# Patient Record
Sex: Female | Born: 1985 | Hispanic: No | Marital: Married | Smoking: Never smoker
Health system: Southern US, Community
[De-identification: ages and names within clinical notes are randomized; demographics above are authoritative.]

## PROBLEM LIST (undated history)

## (undated) DIAGNOSIS — K760 Fatty (change of) liver, not elsewhere classified: Secondary | ICD-10-CM

---
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
## 2010-02-16 ENCOUNTER — Encounter: Payer: Self-pay | Admitting: Maternal & Fetal Medicine

## 2010-02-28 ENCOUNTER — Ambulatory Visit: Payer: Self-pay | Admitting: Internal Medicine

## 2013-01-22 ENCOUNTER — Ambulatory Visit: Payer: Self-pay | Admitting: Family Medicine

## 2013-02-21 ENCOUNTER — Ambulatory Visit: Payer: Self-pay | Admitting: Physician Assistant

## 2015-03-06 IMAGING — CR DG ANKLE COMPLETE 3+V*L*
1 series · 3 of 3 positions shown · non-contrast
Comparison: None.

CLINICAL DATA: Left ankle injury

EXAM:
LEFT ANKLE COMPLETE - 3+ VIEW

[Series 1: ap · 0.17mm/px · 3 of 3 slices shown]
[im 1/3]
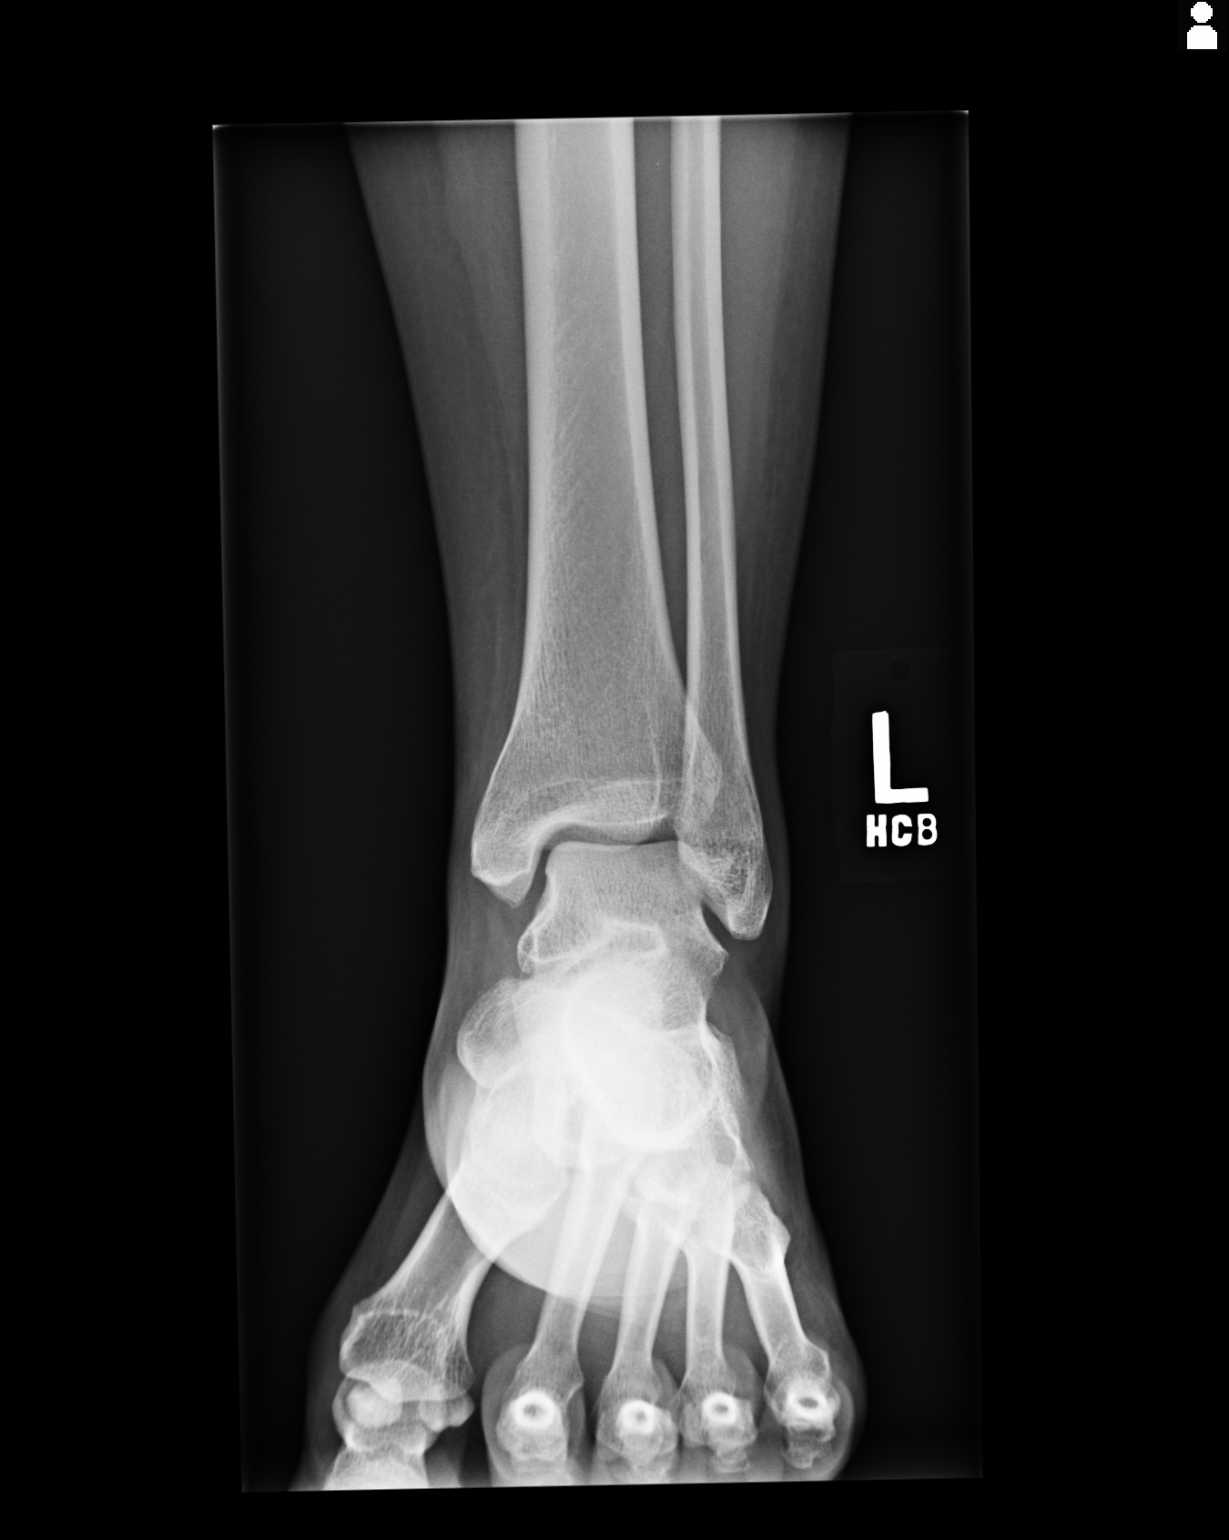
[im 2/3]
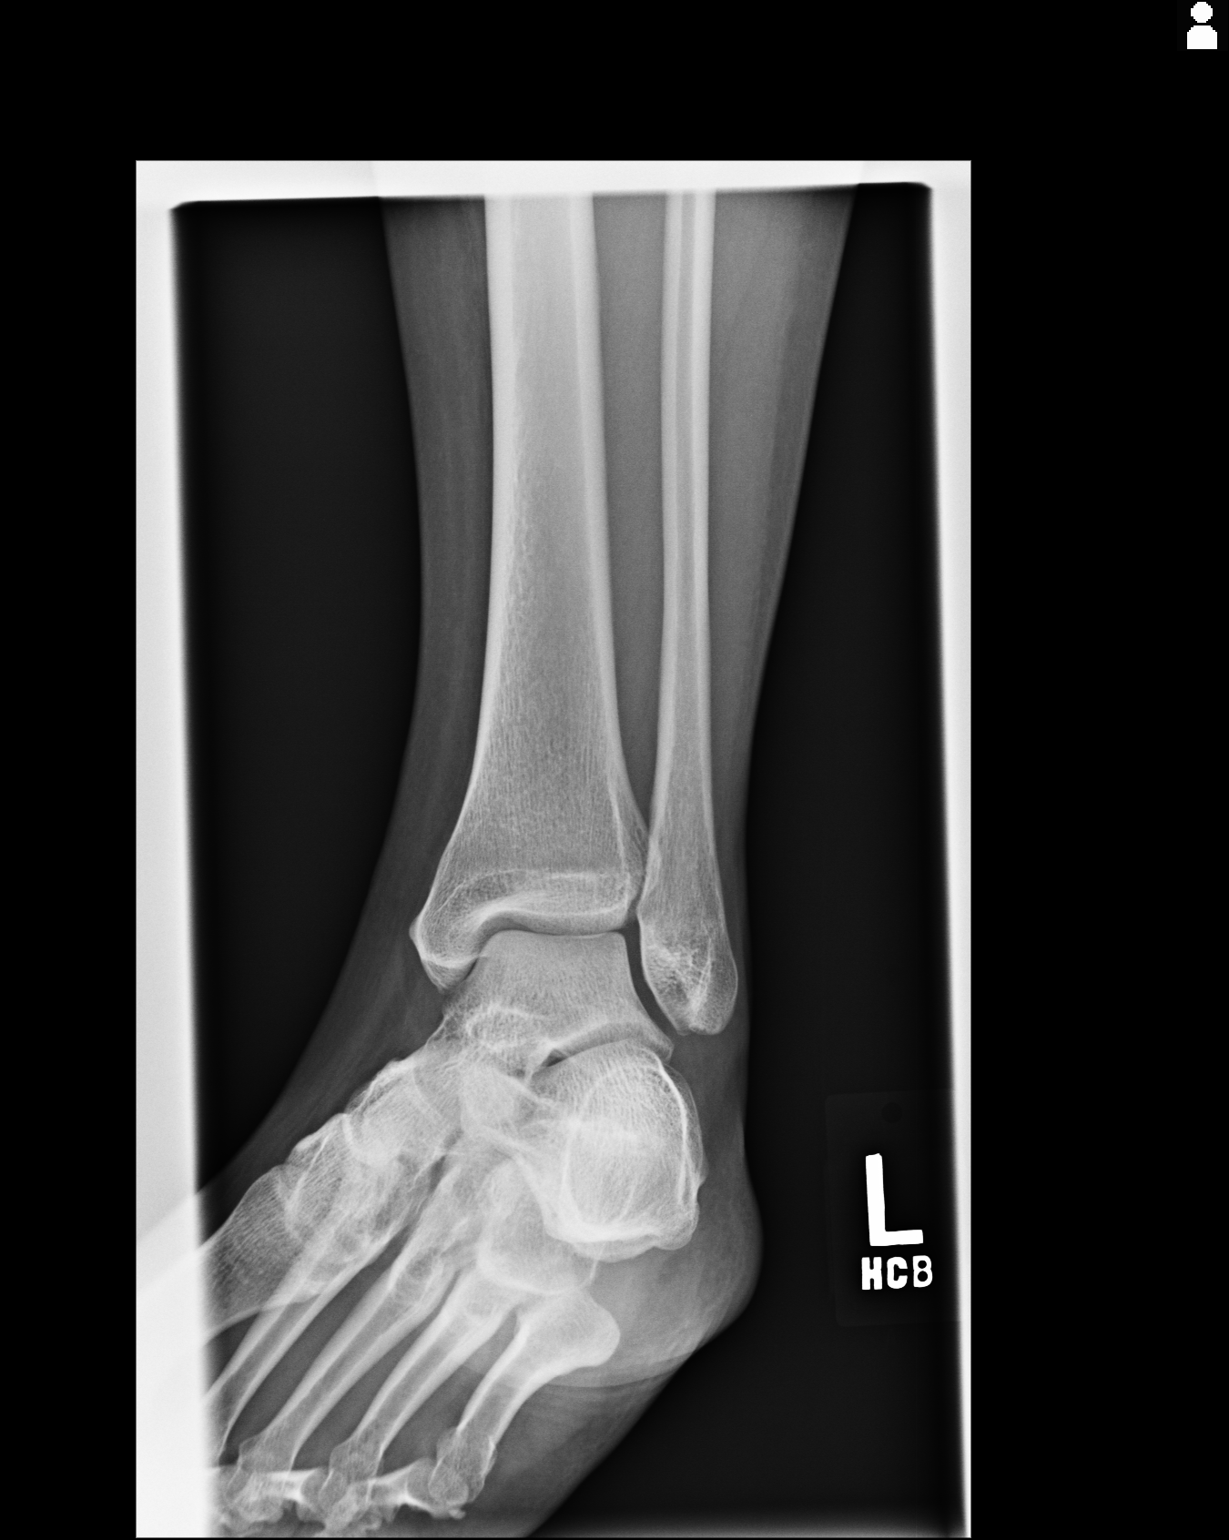
[im 3/3]
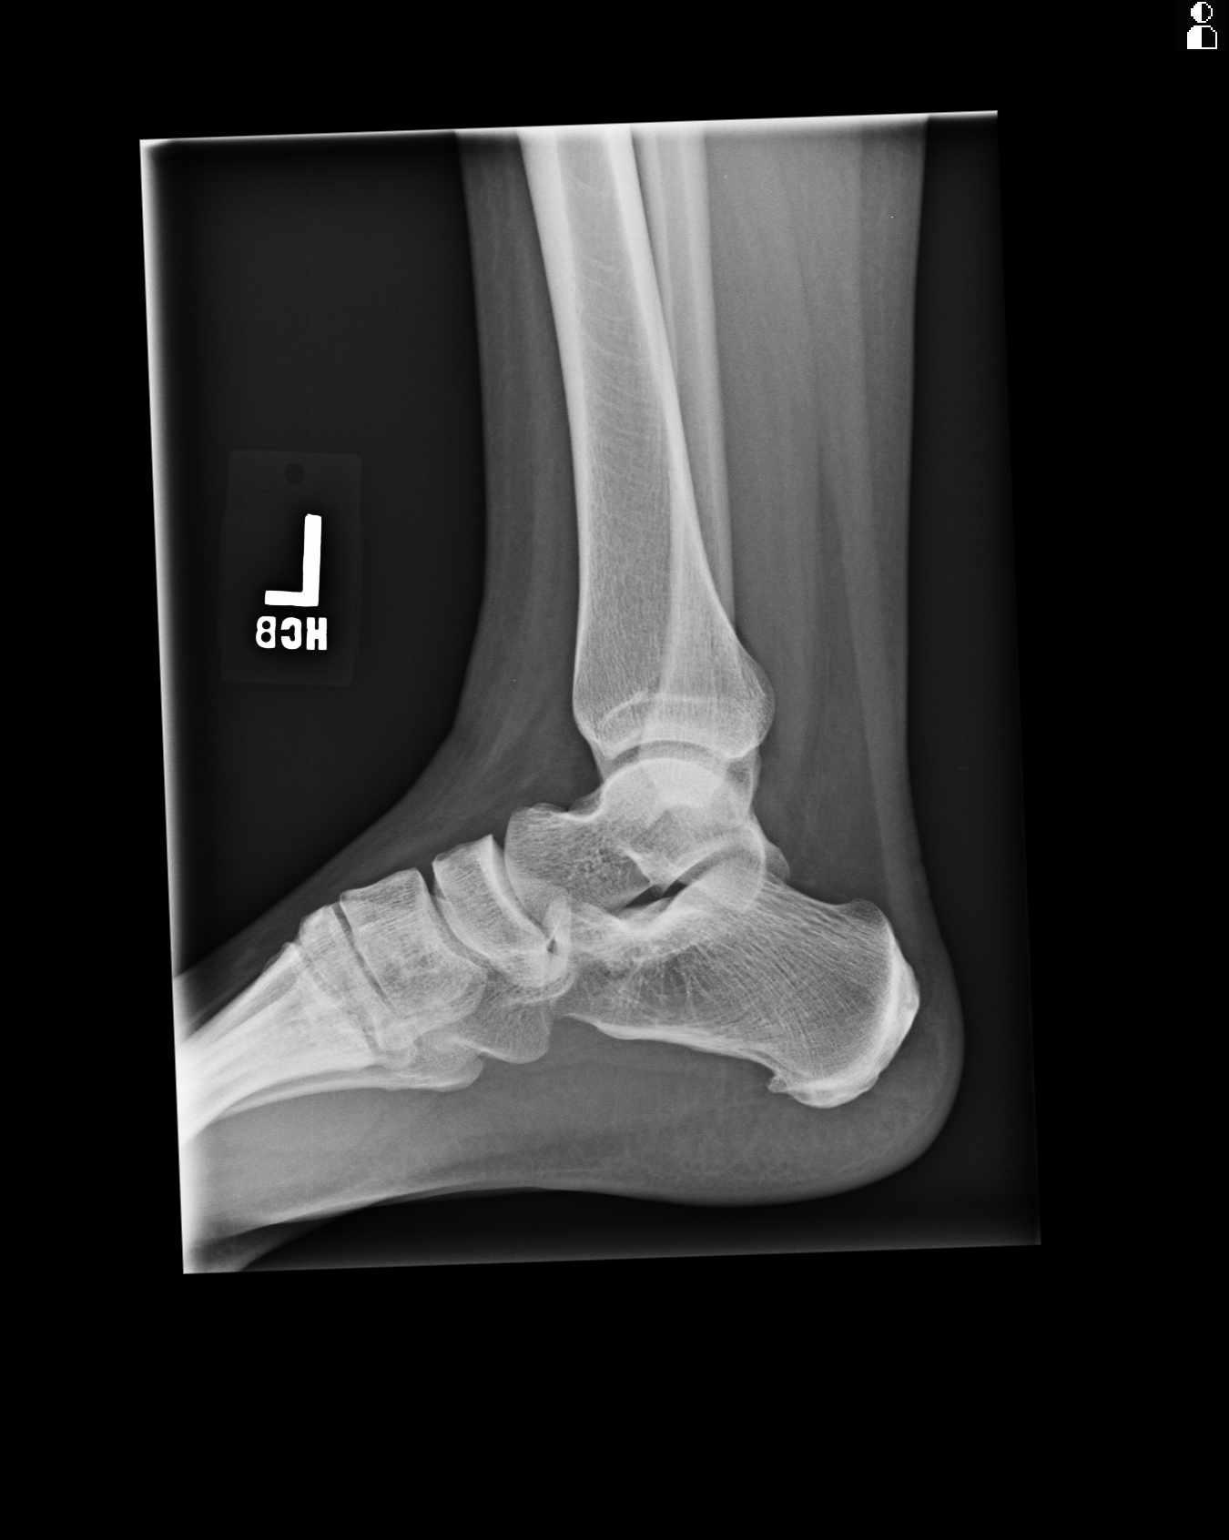

[3 of 3 positions shown; findings below may reference images not displayed]

FINDINGS: Three views of left ankle submitted. No acute fracture or
subluxation. Ankle mortise is preserved. Small plantar spur of
calcaneus.
IMPRESSION: Negative.

## 2015-08-04 ENCOUNTER — Encounter: Payer: Self-pay | Admitting: *Deleted

## 2015-08-04 ENCOUNTER — Ambulatory Visit (INDEPENDENT_AMBULATORY_CARE_PROVIDER_SITE_OTHER): Payer: BLUE CROSS/BLUE SHIELD

## 2015-08-04 ENCOUNTER — Ambulatory Visit
Admission: EM | Admit: 2015-08-04 | Discharge: 2015-08-04 | Disposition: A | Discharge status: Home / Self Care | Payer: BLUE CROSS/BLUE SHIELD | Attending: Family Medicine | Admitting: Family Medicine

## 2015-08-04 DIAGNOSIS — S93401A Sprain of unspecified ligament of right ankle, initial encounter: Secondary | ICD-10-CM

## 2015-08-04 HISTORY — DX: Fatty (change of) liver, not elsewhere classified: K76.0

## 2015-08-04 MED ORDER — KETOROLAC TROMETHAMINE 10 MG PO TABS: 10.0000 mg | ORAL_TABLET | Freq: Four times a day (QID) | ORAL | 0 refills | Status: AC | PRN

## 2015-08-04 MED ORDER — IBUPROFEN 800 MG PO TABS
800.0000 mg | ORAL_TABLET | Freq: Once | ORAL | Status: AC
  Administered 2015-08-04: 800 mg via ORAL

## 2015-08-04 NOTE — ED Triage Notes (Signed)
Pt twisted right ankle and has mild edema to lateral malleolus region and pain with difficulty bearing weight.

## 2015-08-04 NOTE — ED Provider Notes (Signed)
CSN: [id number]     Arrival date & time 08/04/15  1902 History   None    Chief Complaint  Patient presents with  . Ankle Pain   (Consider location/radiation/quality/duration/timing/severity/associated sxs/prior Treatment) Married hispanic female here with spouse fell off curb in 4 inch heels 1 hour ago and twisted right ankle thinks she broke it here for xrays unable to bear weight.  Sprained left ankle last year has cam boot with her in case this one needs immobilization also.  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  Ankle Pain  Location:  Ankle and leg Time since incident:  1 hour Injury: yes   Mechanism of injury: fall   Fall:    Fall occurred:  Standing   Height of fall:  6 inches   Impact surface:  Concrete Ankle location:  R ankle Pain details:    Quality:  Shooting and sharp   Radiates to:  Does not radiate   Severity:  Severe   Onset quality:  Sudden   Duration:  1 hour   Timing:  Constant   Progression:  Worsening Chronicity:  New Foreign body present:  No foreign bodies Prior injury to area:  No Relieved by:  Nothing Worsened by:  Activity, bearing weight, extension, flexion, adduction and abduction Ineffective treatments:  Ice, immobilization and elevation Associated symptoms: decreased ROM and swelling   Associated symptoms: no back pain, no fatigue, no fever, no itching, no muscle weakness, no neck pain, no numbness, no stiffness and no tingling   Risk factors: obesity   Risk factors: no concern for non-accidental trauma, no frequent fractures, no known bone disorder and no recent illness     Past Medical History:  Diagnosis Date  . Fatty liver    Past Surgical History:  Procedure Laterality Date  . CESAREAN SECTION     History reviewed. No pertinent family history. Social History  Substance Use Topics  . Smoking status: Never Smoker  . Smokeless tobacco: Never Used  .  Alcohol use Yes   OB History    No data available     Review of Systems  Constitutional: Negative for fatigue and fever.  Eyes: Negative for photophobia, pain, discharge, redness, itching and visual disturbance.  Respiratory: Negative for shortness of breath and wheezing.   Cardiovascular: Negative for chest pain.  Gastrointestinal: Negative for diarrhea, nausea and vomiting.  Endocrine: Negative for polydipsia, polyphagia and polyuria.  Genitourinary: Negative for difficulty urinating.  Musculoskeletal: Positive for arthralgias, gait problem, joint swelling and myalgias. Negative for back pain, neck pain, neck stiffness and stiffness.  Skin: Positive for color change. Negative for itching, pallor, rash and wound.  Allergic/Immunologic: Negative for food allergies.  Neurological: Negative for dizziness, tremors, seizures, syncope, facial asymmetry, speech difficulty, weakness, light-headedness, numbness and headaches.  Hematological: Negative for adenopathy. Does not bruise/bleed easily.  Psychiatric/Behavioral: Negative for sleep disturbance.    Allergies  Review of patient's allergies indicates no known allergies.  Home Medications   Prior to Admission medications   Medication Sig Start Date End Date Taking? Authorizing Provider  amphetamine-dextroamphetamine (ADDERALL XR) 20 MG 24 hr capsule Take 20 mg by mouth daily.   Yes Historical Provider, MD  FLUoxetine (PROZAC) 20 MG tablet Take 20 mg by mouth daily.   Yes Historical Provider, MD  ketorolac (TORADOL) 10 MG tablet Take 1 tablet (10 mg total) by mouth every 6 (six) hours as  needed. 08/04/15   Barbaraann Barthel, NP   Meds Ordered and Administered this Visit   Medications  ibuprofen (ADVIL,MOTRIN) tablet 800 mg (800 mg Oral Given 08/04/15 2002)    BP (!) 130/91 (BP Location: Left Arm)   Pulse 91   Temp 99 F (37.2 C) (Oral)   Resp 16   Ht 5\' 3"  (1.6 m)   Wt 201 lb (91.2 kg)   SpO2 100%   BMI 35.61 kg/m  No data  found.   Physical Exam  Constitutional: She is oriented to person, place, and time. She appears well-developed and well-nourished. No distress.  HENT:  Head: Normocephalic and atraumatic.  Eyes: Conjunctivae are normal.  Neck: Neck supple.  Cardiovascular: Normal rate and regular rhythm.   No murmur heard. Pulmonary/Chest: Effort normal and breath sounds normal. No respiratory distress.  Abdominal: Soft. There is no tenderness.  Musculoskeletal: She exhibits tenderness. She exhibits no edema or deformity.       Right shoulder: Normal.       Left shoulder: Normal.       Right elbow: Normal.      Left elbow: Normal.       Right hip: Normal.       Left hip: Normal.       Right knee: Normal.       Left knee: Normal.       Right ankle: She exhibits decreased range of motion, swelling and ecchymosis. She exhibits no deformity, no laceration and normal pulse. Tenderness. Lateral malleolus, head of 5th metatarsal and proximal fibula tenderness found. No medial malleolus tenderness found. Achilles tendon normal.       Left ankle: Normal.       Cervical back: Normal.       Thoracic back: Normal.       Lumbar back: Normal.       Right hand: Normal.       Left hand: Normal.       Right upper leg: Normal.       Left upper leg: Normal.       Right lower leg: She exhibits tenderness and bony tenderness. She exhibits no swelling, no edema, no deformity and no laceration.       Left lower leg: Normal. She exhibits no tenderness, no bony tenderness, no swelling, no edema, no deformity and no laceration.       Right foot: There is tenderness, bony tenderness and swelling. There is normal range of motion, normal capillary refill, no crepitus, no deformity and no laceration.       Left foot: Normal.       Feet:  Neurological: She is alert and oriented to person, place, and time. She has normal strength. She is not disoriented. She displays no atrophy and no tremor. No sensory deficit. She exhibits  normal muscle tone. She displays no seizure activity. Gait abnormal. Coordination normal. GCS eye subscore is 4. GCS verbal subscore is 5. GCS motor subscore is 6.  Skin: Skin is warm and dry. Capillary refill takes less than 2 seconds. Bruising and ecchymosis noted. No abrasion, no burn, no laceration, no lesion, no petechiae and no rash noted. No cyanosis or erythema. No pallor. Nails show no clubbing.  Psychiatric: She has a normal mood and affect. Her speech is normal and behavior is normal. Judgment and thought content normal. She is not actively hallucinating. Cognition and memory are normal. She is attentive.  Nursing note and vitals reviewed.   Urgent Care Course  Clinical Course    Procedures (including critical care time)  Labs Review Labs Reviewed - No data to display  Imaging Review Dg Ankle Complete Right  Result Date: 08/04/2015 CLINICAL DATA:  Twisted right ankle EXAM: RIGHT ANKLE - COMPLETE 3+ VIEW COMPARISON:  None. FINDINGS: No fracture or dislocation is seen. The ankle mortise is intact. The base of the fifth metatarsal is unremarkable. Moderate lateral soft tissue swelling. IMPRESSION: No fracture or dislocation is seen. Moderate lateral soft tissue swelling. Electronically Signed   By: Charline Bills M.D.   On: 08/04/2015 20:05  Reviewed [place] Controlled Substances Website: Last Name:  [facility]:  First Name:  [facility]  Zip Code:  Date of Birth:  1985/09/15  Dispensed Start Date:  01/09/2013 Dispensed End Date:  08/04/2015  Recipients:       Dispensed From 01/09/2013 to 08/04/2015 2 out of 2 Recipient(s) Selected Rispoli, Kimanh - DOB: 07-04-1985 - [address] [place], [place] - DOB: 1985/10/28 - [address] " Date Dispensed/ Date Prescribed Drug Name/ NDC Qty. Dispensed/ Days Supply Refill #/ Authorized Refills Gaffer Recipient *Pmt. Method **MED Daily  06/23/2015 06/23/2015 DEXTROAMP- AMPHETAMIN 10 MG  TAB [id number] 60 30 0 0 [id number] MCDANIELS CHRISTOPHER MD [place], [place] [id number] [facility] [place], [place] ARDELL, AARONSON F 17-Aug-1985 [address] [place], [place] [address] 04 0  04/16/2015 04/16/2015 ACETAMINOPHEN- COD #3 TABLET [id number] 4 1 0 0 [id number] Purnell Shoemaker MD [place], [place] [id number] [place] [facility] Teodoro Kil [place] MYRTA, MERCER 1985/08/03 [address] [place], [place] [address] 04 18  03/26/2015 03/24/2015 DEXTROAMP- AMPHETAMIN 10 MG TAB [id number] 60 30 0 0 [id number] MCDANIELS CHRISTOPHER MD [place], [place] [id number] [facility] [place], [place] JAEDA, BRUSO F 1985/03/28 [address] [place], [place] [address] 04 0  11/16/2014 11/11/2014 DEXTROAMP- AMPHETAMIN 10 MG TAB [id number] 60 30 0 0 [id number] MCDANIELS CHRISTOPHER MD [place], [place] [id number] [facility] [place], [place] DANETTE, WEINFELD F 07-10-1985 [address] [place], [place] [address] 04 0  07/14/2014 07/14/2014 DEXTROAMP- AMPHETAMIN 10 MG TAB [id number] 60 30 0 0 [id number] MCDANIELS CHRISTOPHER MD [place], [place] [id number] [place] [facility] [place], [employer] MARWA, FUHRMAN [birth year] [address] [place], [place] [address] 04 0  05/08/2014 05/08/2014 DEXTROAMP- AMPHETAMIN 10 MG TAB [id number] 60 30 0 0 [id number] MCDANIELS CHRISTOPHER MD [place], [place] [id number] [place] [facility] [place], [place] TANNISHA, KENNINGTON 20-Feb-1985 [address] [place], [place] [address] 04 0  03/28/2014 03/28/2014 DEXTROAMP- AMPHETAMIN 10 MG TAB [id number] 60 30 0 0 [id number] MCDANIELS CHRISTOPHER MD [place], [place] [id number] [place] [facility] [place], [place] BUFFIE, HERNE 10/13/1985 [address] [place], [place] [address] 04 0  01/23/2014 01/23/2014 DEXTROAMP- AMPHETAMIN 10 MG TAB [id number] 60 30 0 0 [id number] MCDANIELS CHRISTOPHER MD [place], [place] [id number] [place] [facility] [place], [place] CANDELA, KRUL 1985-05-29 [address] [place], [place] [address] 04 0  12/24/2013 12/18/2013 DEXTROAMP- AMPHETAMIN 10 MG TAB [id number] 45 30 0 0 [id number] MCDANIELS CHRISTOPHER MD [place], [place] [id number] [place] [facility] [place], [place] SHALONDRA, WUNSCHEL [birth year] [address] [place], [place] [address] 04 0  11/22/2013 11/22/2013 DEXTROAMP- AMPHETAMIN 10 MG TAB [id number] 45 30 0 0 [id number] MCDANIELS CHRISTOPHER MD [place], [place] [id number] [place] [facility] [place],  JAPLEEN, TORNOW 03/29/1985 [address] [place], [place] [address] 04  0  10/24/2013 10/18/2013 DEXTROAMP- AMPHETAMIN 10 MG TAB [id number] 45 30 0 0 [id number] MCDANIELS CHRISTOPHER MD [place], [place] [id number] [place] [facility] [place], [place] JALEE, SAINE 1985/10/05 [address] [place], [place] [address] 04 0  08/29/2013 08/18/2013 DEXTROAMP- AMPHETAMIN 10 MG TAB [id number] 45 30 0 0 [id number] MCDANIELS CHRISTOPHER MD [place], [place] [id number] [employer] Jeanice Lim [place], [place] 07/31/1985 [address] [place], [place] [address] 04 0  08/08/2013 08/08/2013 HYDROCODON- ACETAMINOPHEN 5- 325 [id number] 30 4 0 0 [id number] DUBERMAN ERIC D MD [place], [place] [id number] [place] [facility] [place], [place] MANYA, BALASH [birth year] [address] [place], [place] [address] 04 37.5  07/18/2013 07/18/2013 DEXTROAMP- AMPHETAMIN 10 MG TAB [id number] 45 30 0 0 [id number] MCDANIELS CHRISTOPHER MD [place], [place] [id number] [employer] Jeanice Lim [place] Naveya, [place] Jul 22, 1985 [address] [place], [place] [address] 04 0  06/24/2013 06/16/2013 DEXTROAMP- AMPHETAMIN 10 MG TAB [id number] 45 30 0 0 [id number] MCDANIELS CHRISTOPHER MD [place], [place] [id number] [employer] Jeanice Lim, [place], [place] 11-Nov-1985 [address] [place], [place] [address] 04 0  05/16/2013 05/16/2013 DEXTROAMP- AMPHETAMIN 10 MG TAB [id number] 45 30 0 0  [id number] PATEL KIRTIDA [place], [place] [id number] [place] [facility] [place], [place] ZAMANI, CROCKER November 22, 1985 [address] [place], [place] [address] 04 0  03/02/2013 03/02/2013 OXYCODONE- ACETAMINOPHEN 5- 325 [id number] 12 2 0 0 [id number] [facility] [place], [place] [id number] [place] [facility] [place], [place] JUANICE, WARBURTON October 25, 1985 [address] [place], [place] [address] 04 45  01/22/2013 01/22/2013 HYDROCODON- ACETAMINOPHEN 5- 300 [id number] 12 4 0 0 [id number] PATEL KIRTIDA [place], [place] [id number] [place] [facility] [place], [place] TERRELL, SHIMKO F 1985-10-10 [address] [place], [place] [address] 04 15    *Pmt. Method:01=Private Pay; 02=Medicaid; 03=Medicare; 04=Commercial Insurance; Psychologist, educational and VA; 06=Worker's Compensation; 07=Indian Nations; 99=Other  **Per CDC guidance, the conversion factors and associated daily morphine milligram equivalents for drugs prescribed as part of medication-assisted treatment for opioid use disorder should not be used to benchmark against dosage thresholds meant for opioids prescribed for pain. MED Summary This section displays cumulative MED values by unique recipient. The "MED Max" value is the maximum occurrence of cumulative MED sustained for any 3 consecutive days. This value is calculated based on prescriptions dispensed during the date range requested.  MED Max Recipient  [address] ALLIX, BLOMQUIST 11-04-1985 [address] [place], [place] [address]   2002 motrin  po given by RN Scarlette Calico to patient, ice on right ankle and elevated in exam room for entire duration of visit except for trip to xray department in wheelchair.  2035 patient reported slight relief of pain with rest, elevation and motrin.  Discussed xray results with patient and given copy of radiology report negative for fracture.  Patient and spouse verbalized understanding information and had no further questions at this  time.   MDM   1. Ankle sprain, right, initial encounter    Patient was instructed to rest, ice and elevate the ankle as much as possible.  Activity as tolerated and work on ROM exercises.  Patient is to take toradol  po QID starting tomorrow.  Had motrin today.  Discussed no tylenol/motrin/ibuprofen/naproxen/advil or aleve while on toradol and max days 5 for use toradol.  Discussed at risk to reinjure ankle over the next year and to wear supportive footwear/ankle sleeve/ace  bandage.  Follow up with PCM if symptoms persist greater than 4 weeks for re-evaluation.  Did not require work restriction note.  Spouse and  Patient verbalized agreement and understanding of treatment plan and had no further questions at this time  Patient has crutches and cam boot at home.   P2:  Injury Prevention and Fitness.    Barbaraann Barthel, NP 08/04/15 2039

## 2017-08-16 IMAGING — CR DG ANKLE COMPLETE 3+V*R*
3 series · 3 of 3 positions shown · non-contrast
Comparison: None.

CLINICAL DATA: Twisted right ankle

EXAM:
RIGHT ANKLE - COMPLETE 3+ VIEW

[ankle obl (1 of 2)]
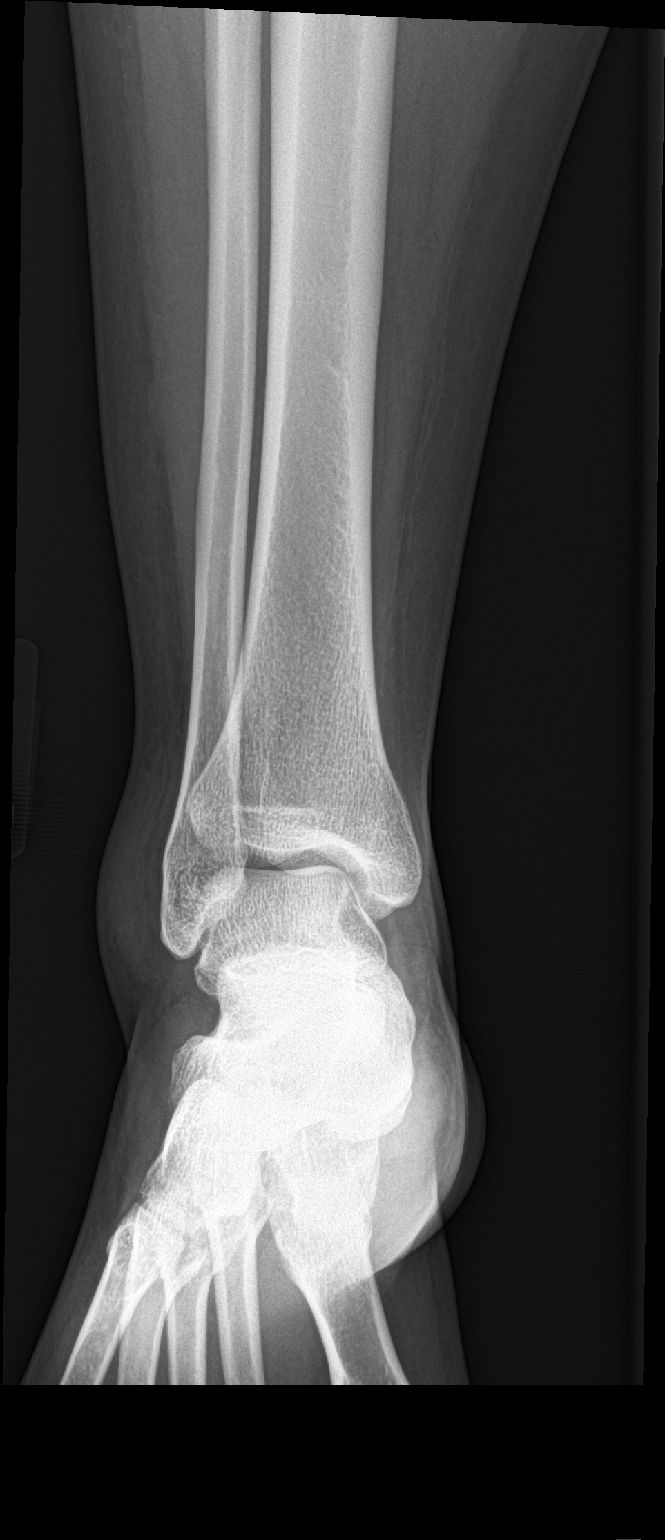

[ankle lat]
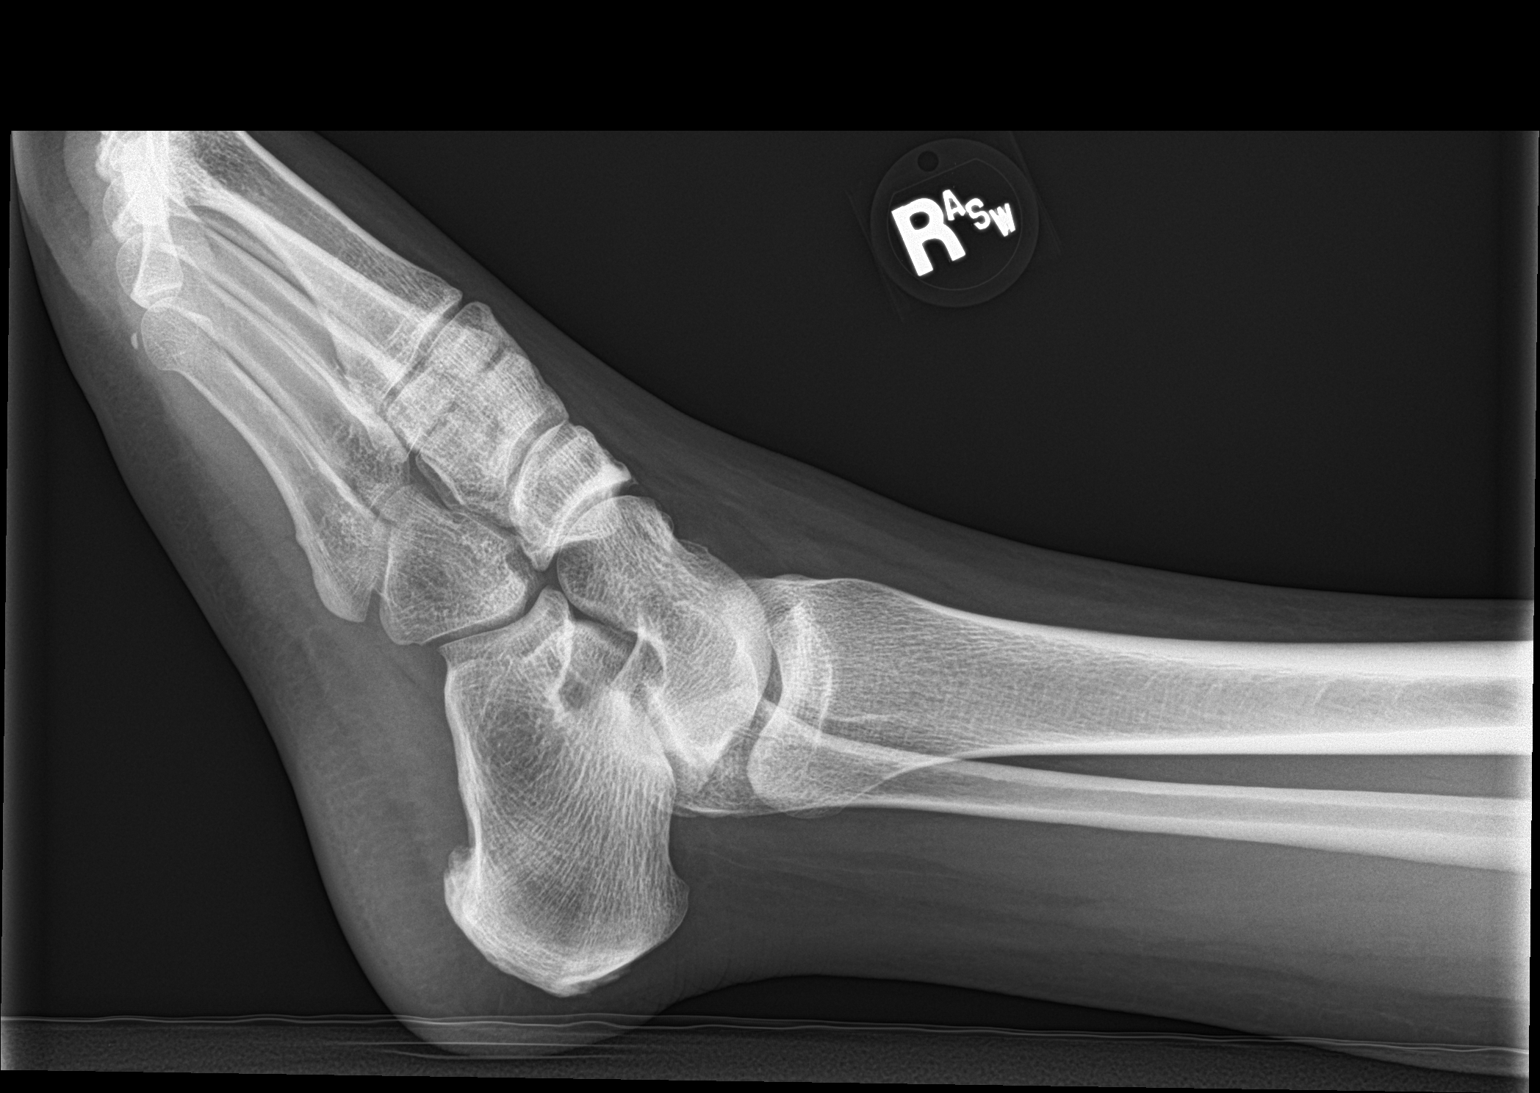

[ankle obl (2 of 2)]
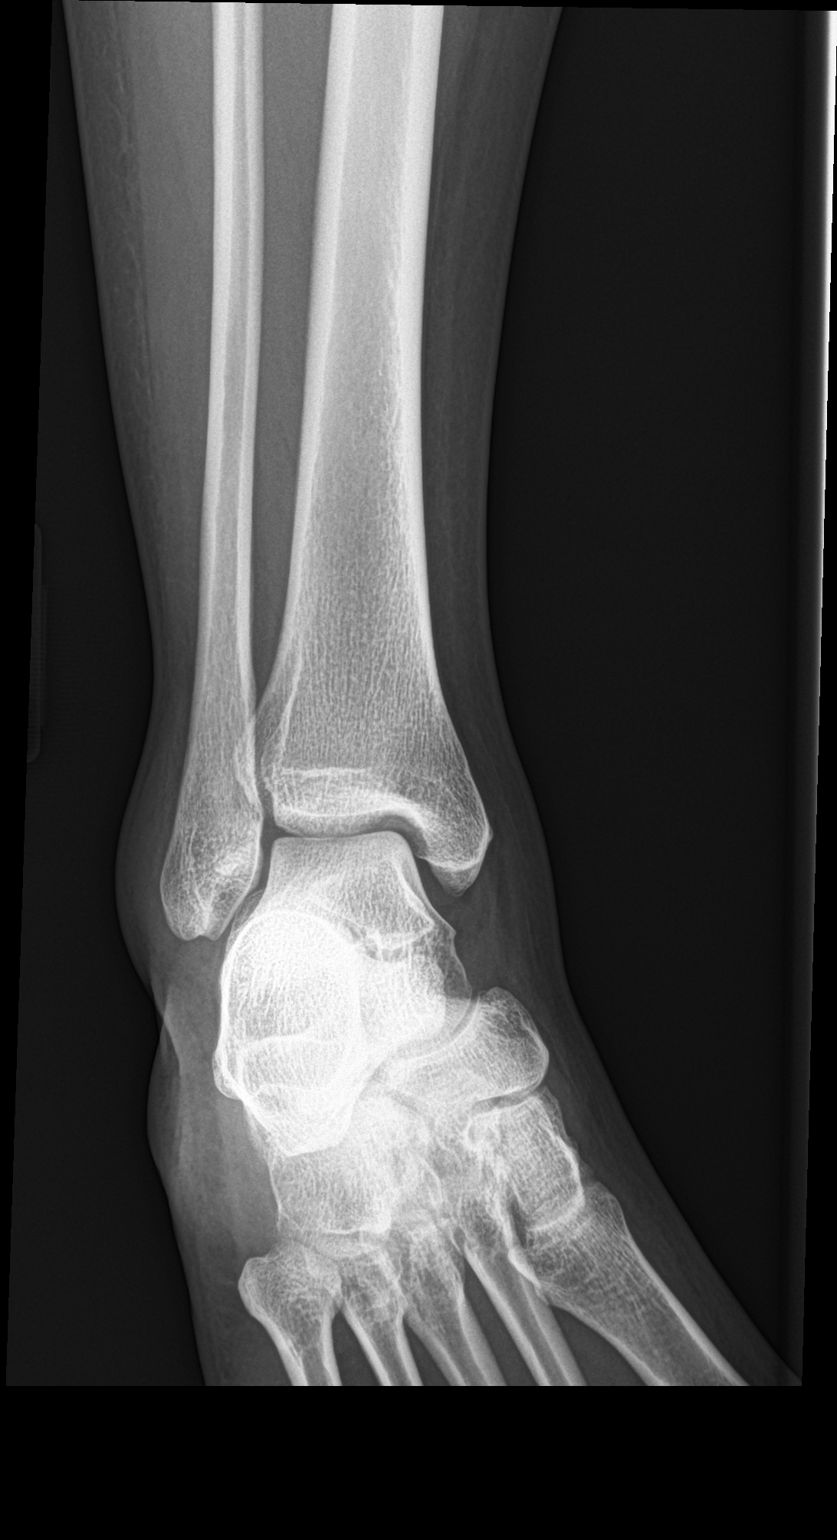

[3 of 3 positions shown; findings below may reference images not displayed]

FINDINGS: No fracture or dislocation is seen.

The ankle mortise is intact.

The base of the fifth metatarsal is unremarkable.

Moderate lateral soft tissue swelling.
IMPRESSION: No fracture or dislocation is seen.

Moderate lateral soft tissue swelling.
